# Patient Record
Sex: Female | Born: 1980 | Race: White | Hispanic: Yes
Health system: Southern US, Community
[De-identification: ages and names within clinical notes are randomized; demographics above are authoritative.]

---

## 2019-04-14 ENCOUNTER — Other Ambulatory Visit: Payer: Self-pay | Admitting: *Deleted

## 2019-04-14 DIAGNOSIS — Z20822 Contact with and (suspected) exposure to covid-19: Secondary | ICD-10-CM

## 2019-04-16 LAB — NOVEL CORONAVIRUS, NAA: SARS-CoV-2, NAA: NOT DETECTED

## 2019-04-21 ENCOUNTER — Other Ambulatory Visit: Payer: Self-pay | Admitting: *Deleted

## 2019-04-21 DIAGNOSIS — Z20822 Contact with and (suspected) exposure to covid-19: Secondary | ICD-10-CM

## 2019-04-23 LAB — NOVEL CORONAVIRUS, NAA: SARS-CoV-2, NAA: NOT DETECTED

## 2019-04-24 ENCOUNTER — Telehealth: Payer: Self-pay | Admitting: General Practice

## 2019-04-24 NOTE — Telephone Encounter (Signed)
Patient is calling to receive her negative COVID test results. Patient expressed understanding. 

## 2020-01-19 ENCOUNTER — Other Ambulatory Visit: Payer: Self-pay | Admitting: Nurse Practitioner

## 2020-01-19 ENCOUNTER — Other Ambulatory Visit (HOSPITAL_COMMUNITY): Payer: Self-pay | Admitting: Nurse Practitioner

## 2020-01-19 DIAGNOSIS — N852 Hypertrophy of uterus: Secondary | ICD-10-CM

## 2020-01-19 DIAGNOSIS — N921 Excessive and frequent menstruation with irregular cycle: Secondary | ICD-10-CM

## 2020-01-27 ENCOUNTER — Ambulatory Visit (HOSPITAL_COMMUNITY)
Admission: RE | Admit: 2020-01-27 | Discharge: 2020-01-27 | Disposition: A | Discharge status: Home / Self Care | Payer: Self-pay | Source: Ambulatory Visit | Attending: Nurse Practitioner | Admitting: Nurse Practitioner

## 2020-01-27 ENCOUNTER — Other Ambulatory Visit: Payer: Self-pay

## 2020-01-27 DIAGNOSIS — N921 Excessive and frequent menstruation with irregular cycle: Secondary | ICD-10-CM | POA: Insufficient documentation

## 2020-01-27 DIAGNOSIS — N852 Hypertrophy of uterus: Secondary | ICD-10-CM | POA: Insufficient documentation

## 2020-04-07 ENCOUNTER — Other Ambulatory Visit: Payer: Self-pay | Admitting: *Deleted

## 2020-04-07 DIAGNOSIS — R928 Other abnormal and inconclusive findings on diagnostic imaging of breast: Secondary | ICD-10-CM

## 2020-04-19 ENCOUNTER — Other Ambulatory Visit: Payer: Self-pay

## 2020-04-19 ENCOUNTER — Ambulatory Visit
Admission: RE | Admit: 2020-04-19 | Discharge: 2020-04-19 | Disposition: A | Discharge status: Home / Self Care | Payer: No Typology Code available for payment source | Source: Ambulatory Visit | Attending: *Deleted | Admitting: *Deleted

## 2020-04-19 DIAGNOSIS — R928 Other abnormal and inconclusive findings on diagnostic imaging of breast: Secondary | ICD-10-CM

## 2022-02-28 IMAGING — US US PELVIS COMPLETE WITH TRANSVAGINAL
1 series · 13 of 25 positions shown · non-contrast
Comparison: None

CLINICAL DATA: Enlarged uterus on exam, pelvic pain for 4 months,
heavy menses for 2 weeks, LMP 01/18/2020, past history Caesarean
section and tubal ligation

EXAM:
TRANSABDOMINAL AND TRANSVAGINAL ULTRASOUND OF PELVIS
TECHNIQUE: Both transabdominal and transvaginal ultrasound examinations of the
pelvis were performed. Transabdominal technique was performed for
global imaging of the pelvis including uterus, ovaries, adnexal
regions, and pelvic cul-de-sac. It was necessary to proceed with
endovaginal exam following the transabdominal exam to visualize the
endometrium and ovaries.

[Series 1: us pelvic complete with transvaginal · 13 of 96 slices shown]
[im 1/96]
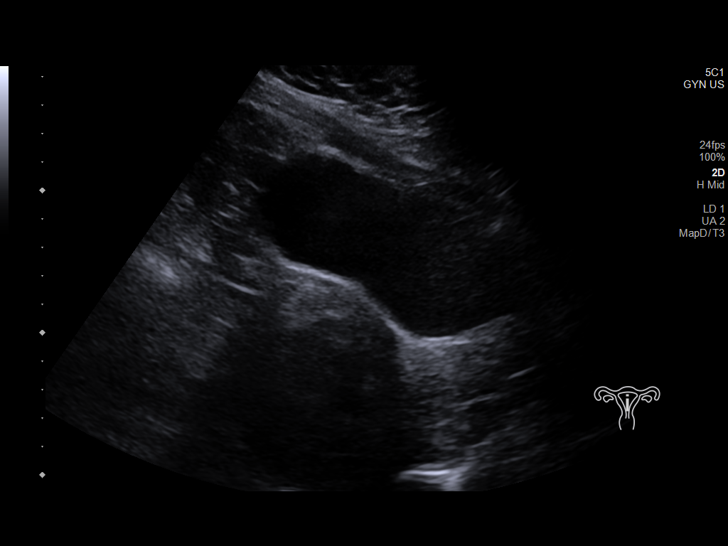
[im 8/96]
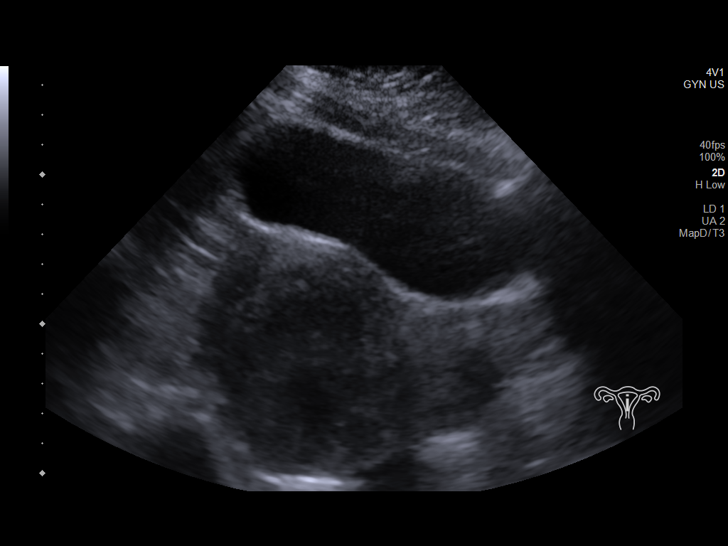
[im 16/96]
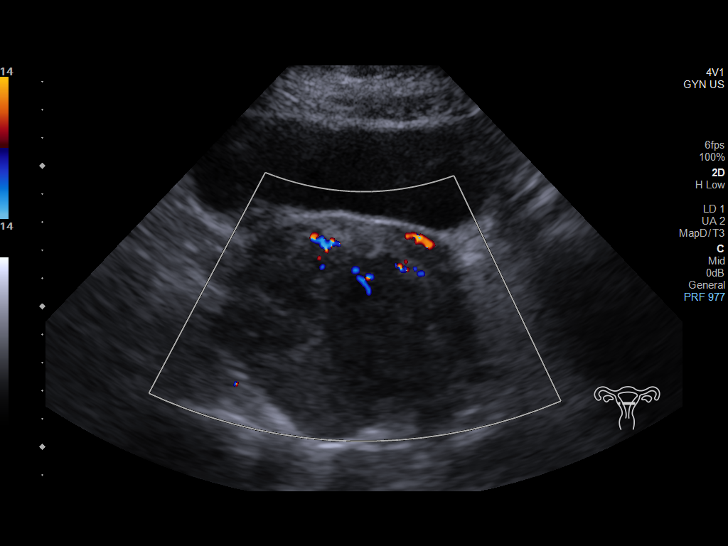
[im 24/96]
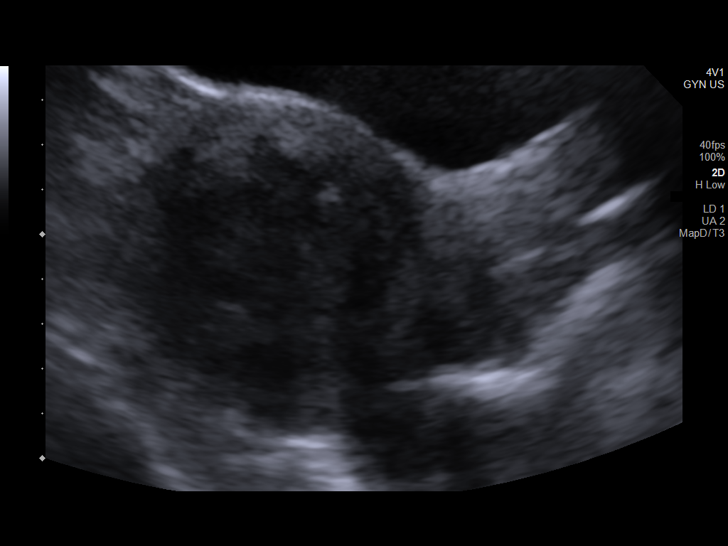
[im 32/96]
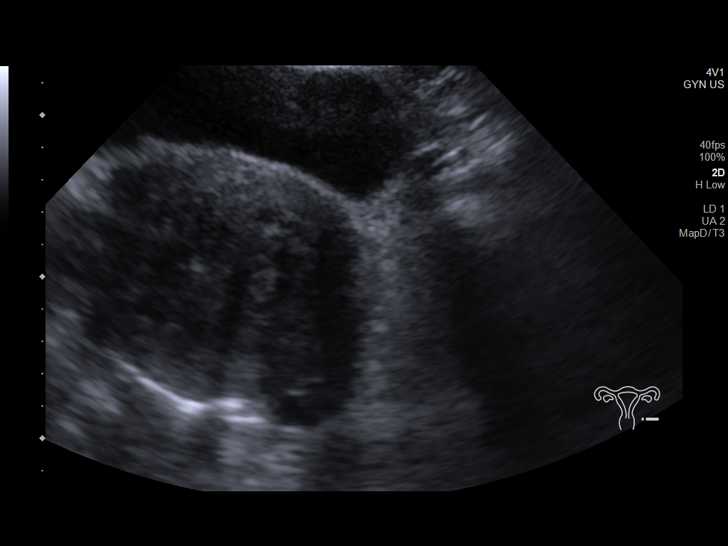
[im 40/96]
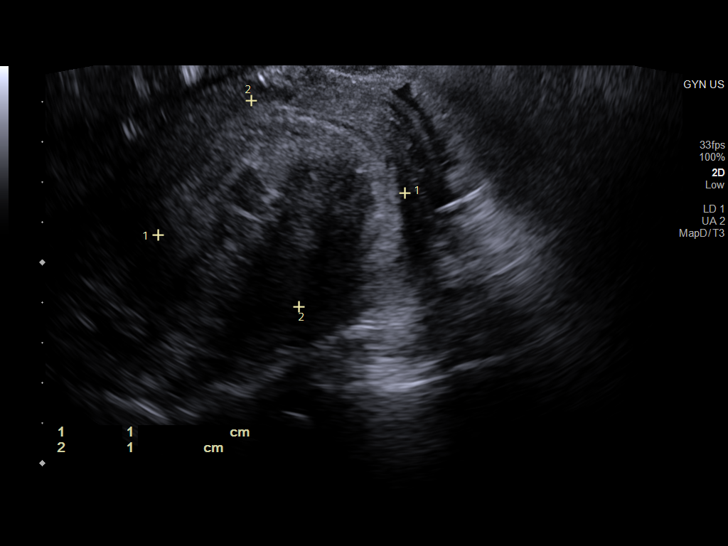
[im 48/96]
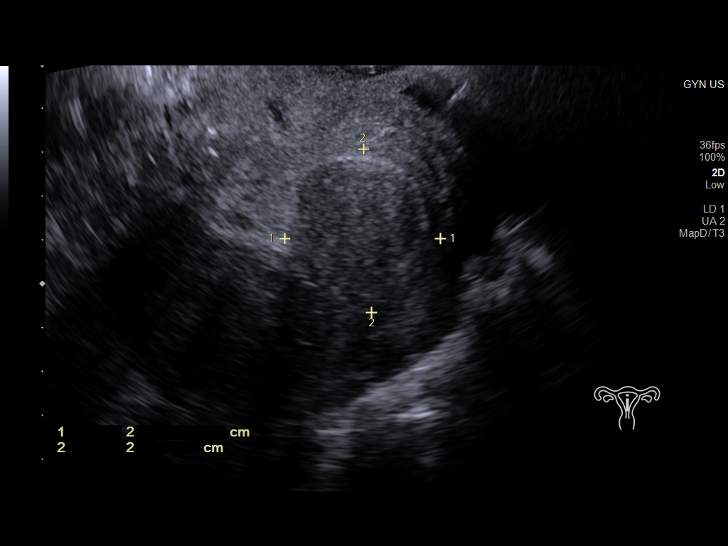
[im 56/96]
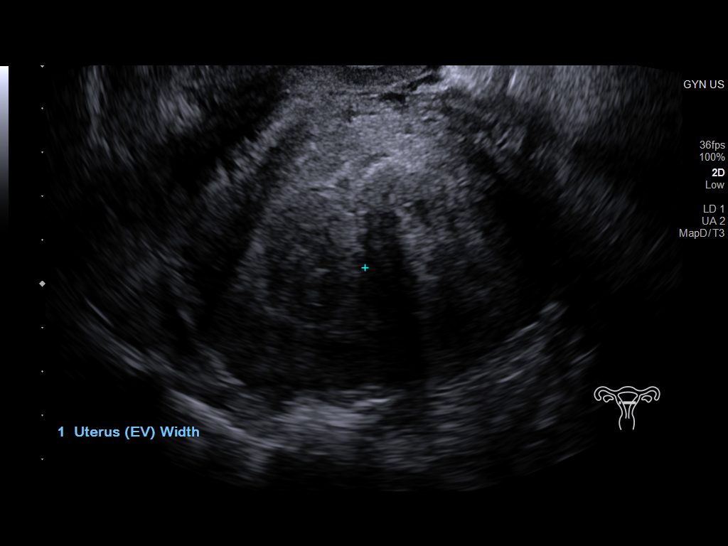
[im 64/96]
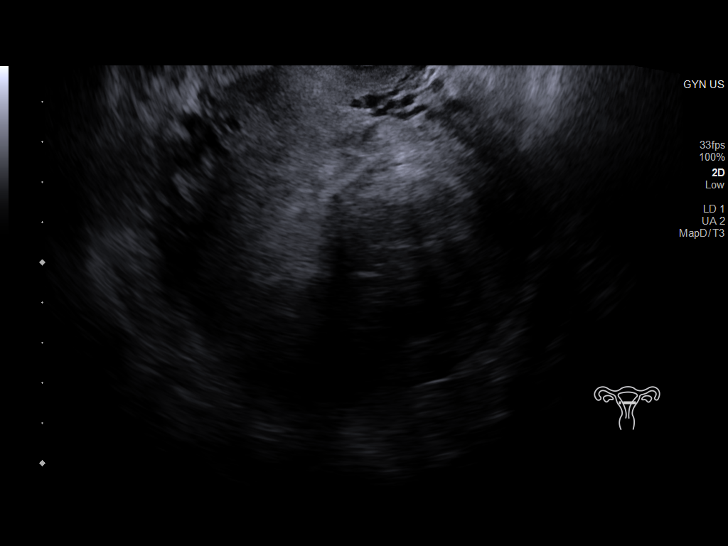
[im 72/96]
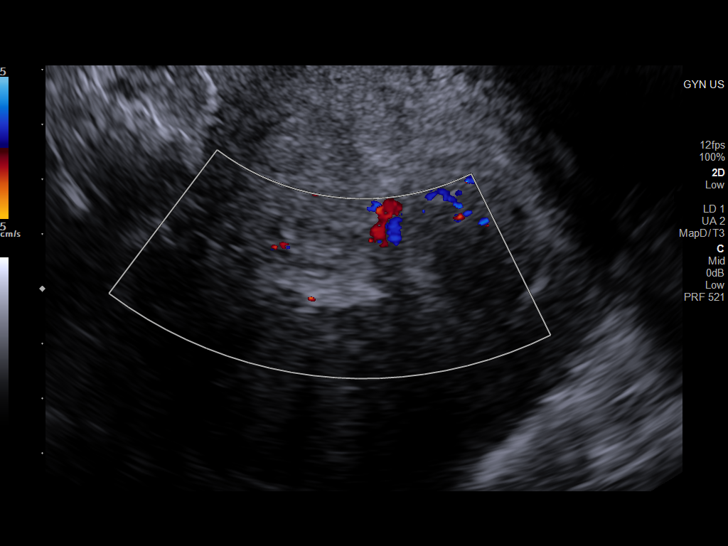
[im 80/96]
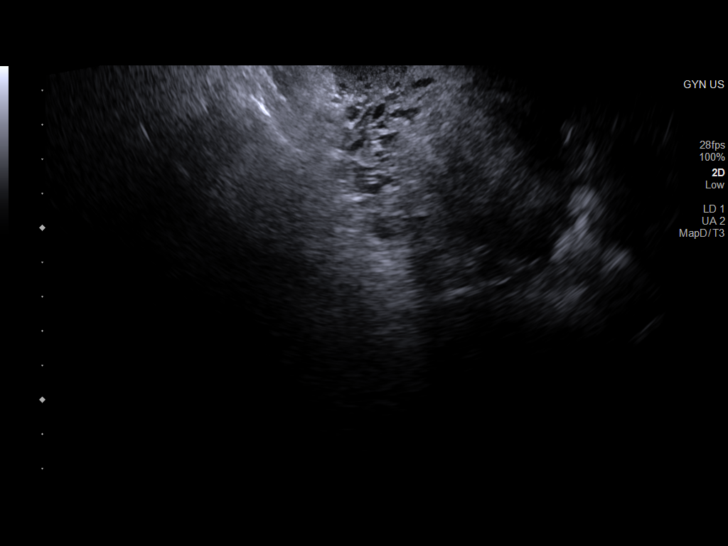
[im 88/96]
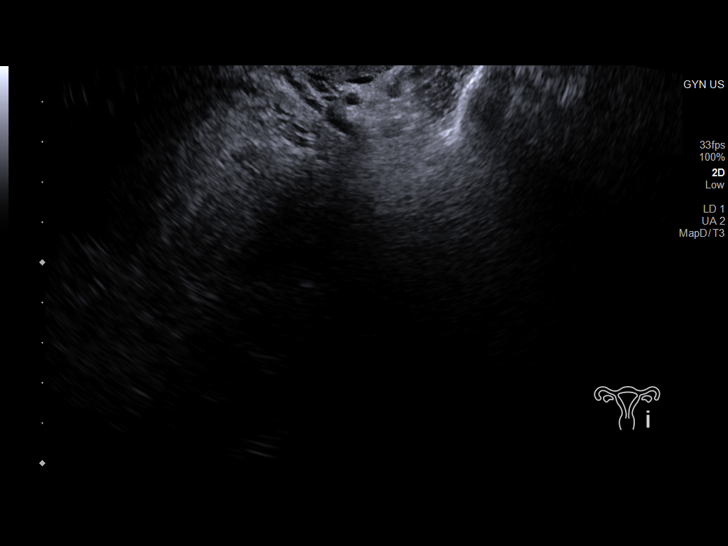
[im 96/96]
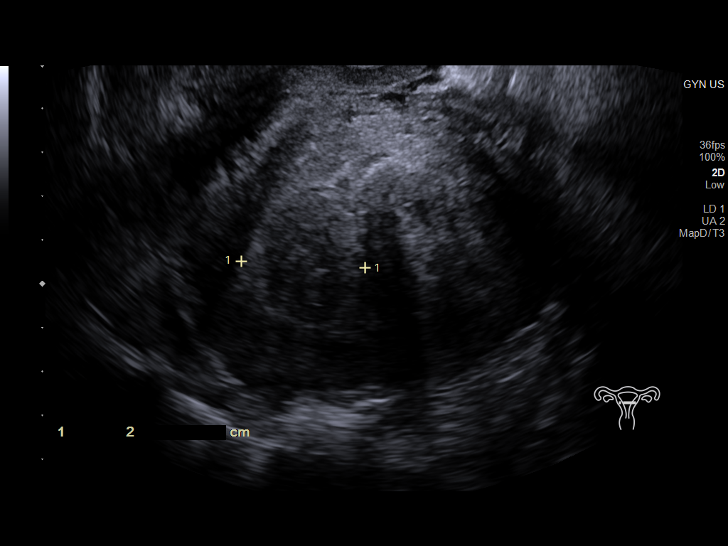

[13 of 25 positions shown; findings below may reference images not displayed]

FINDINGS: Uterus

Measurements: 10.8 x 7.4 x 9.9 cm = volume: 410 mL. Anteverted.
Large mass at central upper uterus 6.2 x 5.3 x 3.8 cm consistent
with a leiomyoma, most likely extending submucosal. Additional
posterior submucosal leiomyoma 3.6 x 3.7 x 2.5 cm at mid uterus.
Transmural leiomyoma at posterior upper uterus 5.7 x 4.3 x 4.0 cm.

Endometrium

Thickness: 6 mm.  No definite endometrial fluid or focal abnormality

Right ovary

Measurements: 2.6 x 2.0 x 2.3 cm = volume: 6.2 mL. Poorly visualized
due to position. No gross mass.

Left ovary

Measurements: 3.3 x 2.6 x 3.3 cm = volume: 15.0 mL. Poorly
visualized due to position, incompletely characterized.

Other findings

No free pelvic fluid or adnexal masses.
IMPRESSION: At least 3 uterine leiomyomata as above, 2 of which likely extends
submucosal.

Suboptimal visualization of ovaries.

## 2022-05-22 IMAGING — MG MM BREAST BX W/ LOC DEV 1ST LESION IMAGE BX SPEC STEREO GUIDE*R*
8 of 14 series · 8 of 26 positions shown · non-contrast
Comparison: Previous exams.
COMPARISON: Previous exams.

Addendum:
CLINICAL DATA: Patient presents for stereotactic core needle biopsy
of indeterminate right breast calcifications.

EXAM:
RIGHT BREAST STEREOTACTIC CORE NEEDLE BIOPSY

[R (1 of 8)]
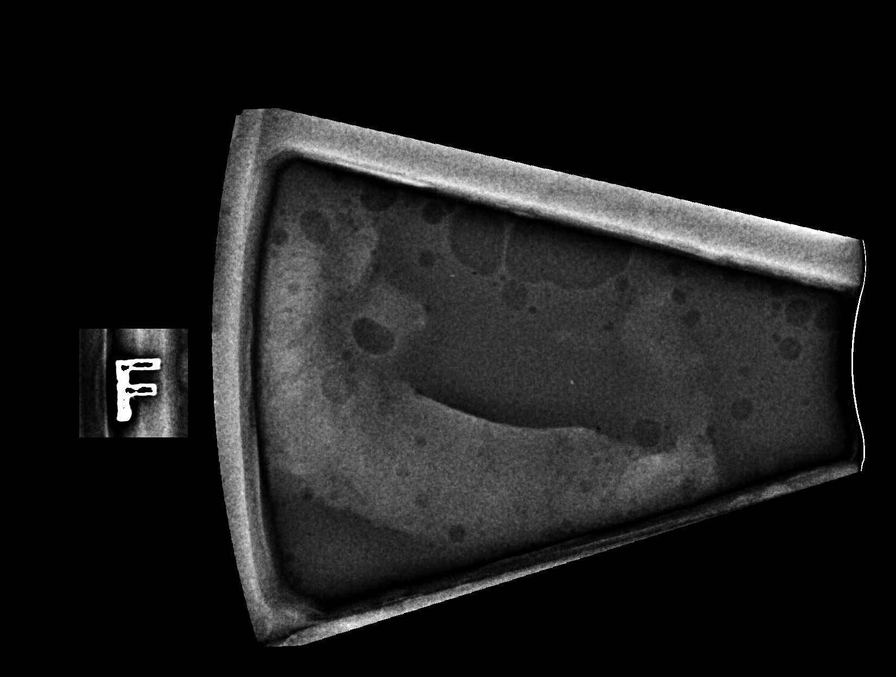

[R (2 of 8)]
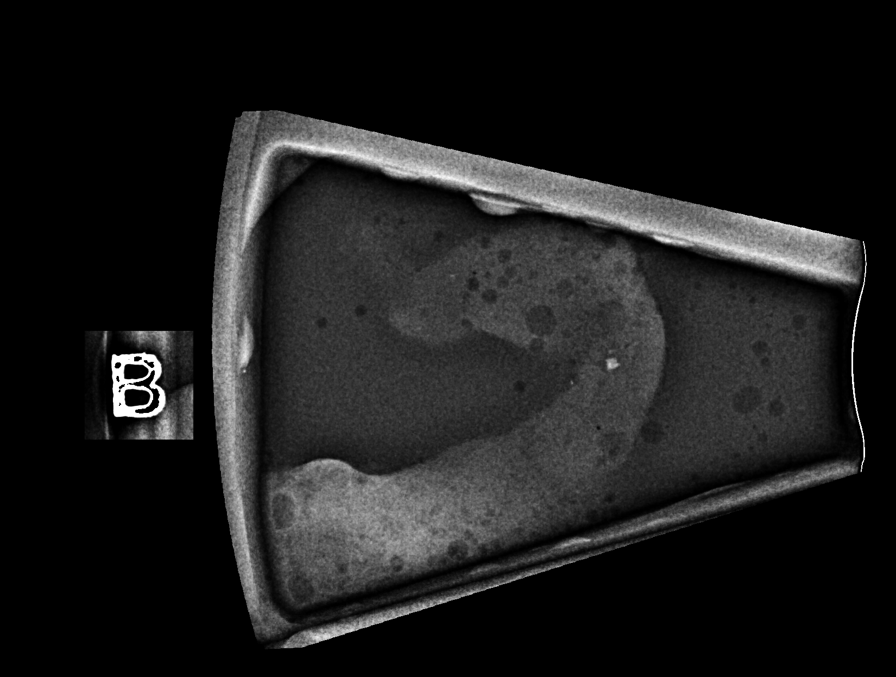

[R (3 of 8)]
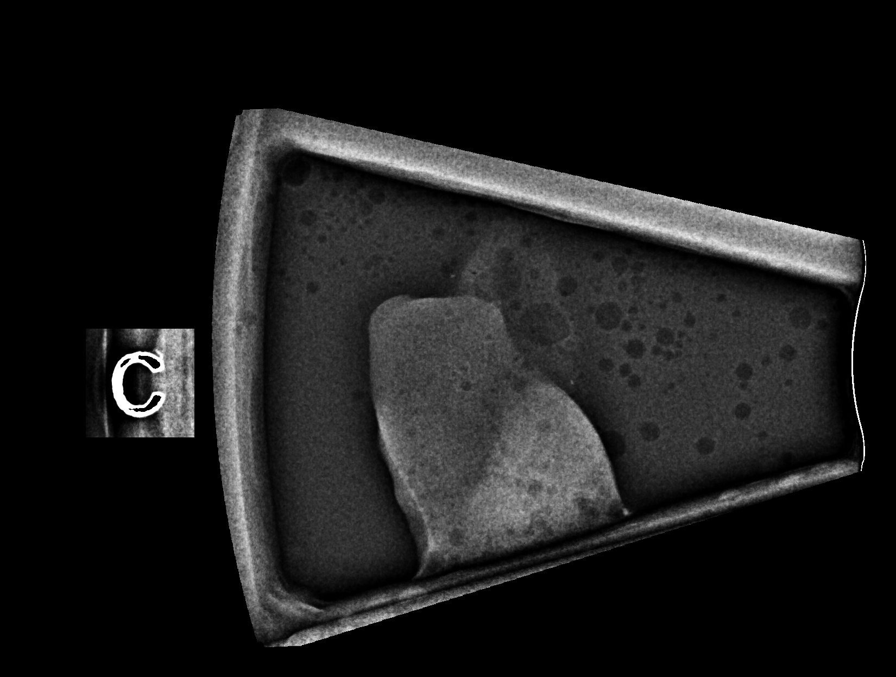

[R (4 of 8)]
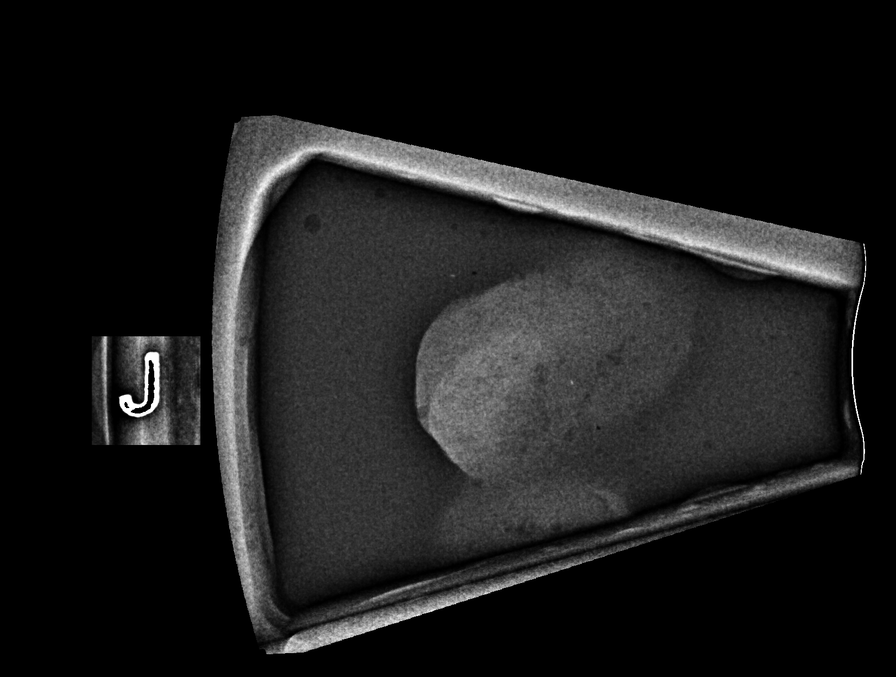

[R (5 of 8)]
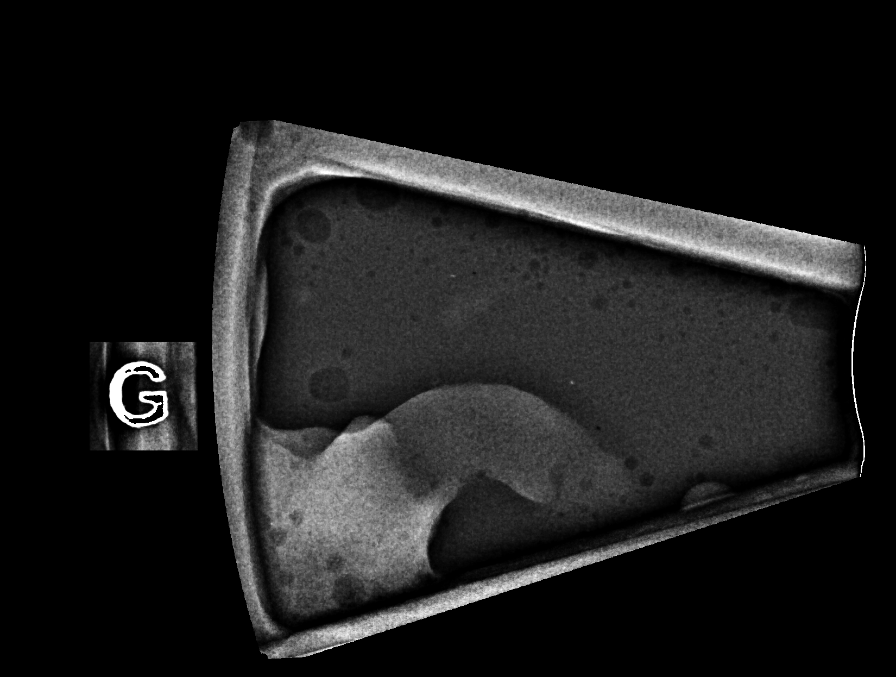

[R (6 of 8)]
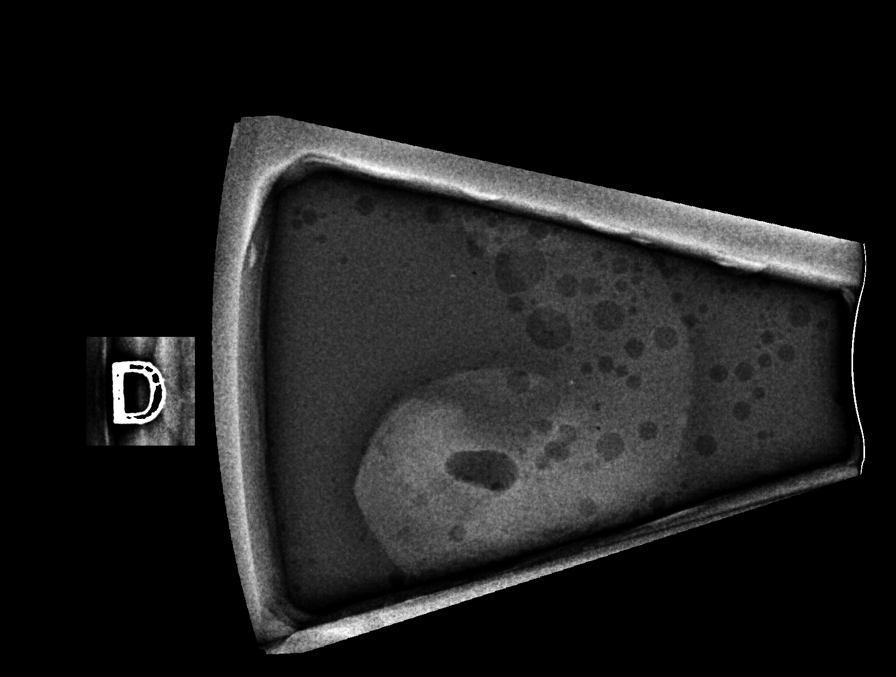

[R (7 of 8)]
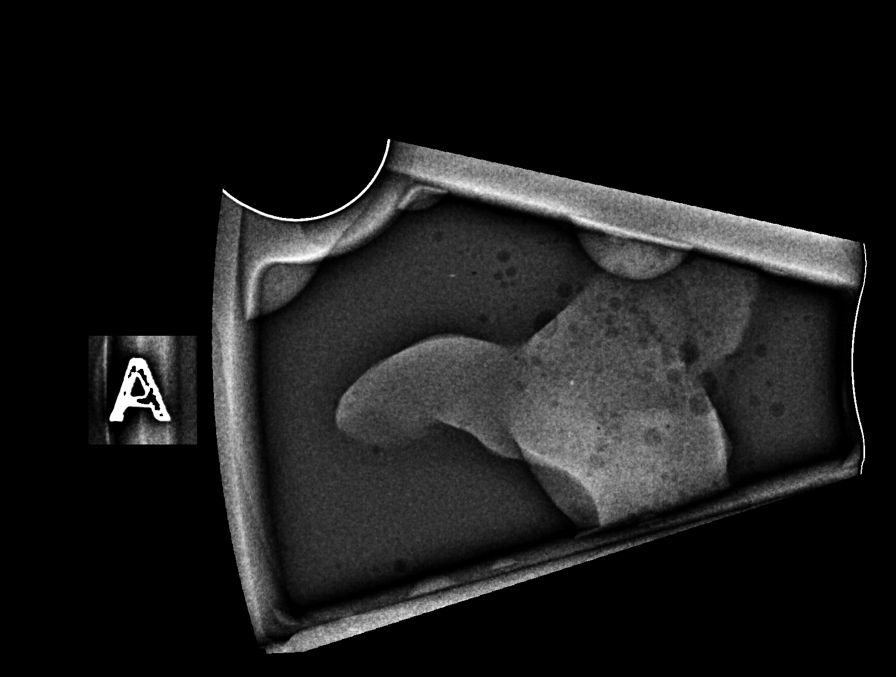

[R (8 of 8)]
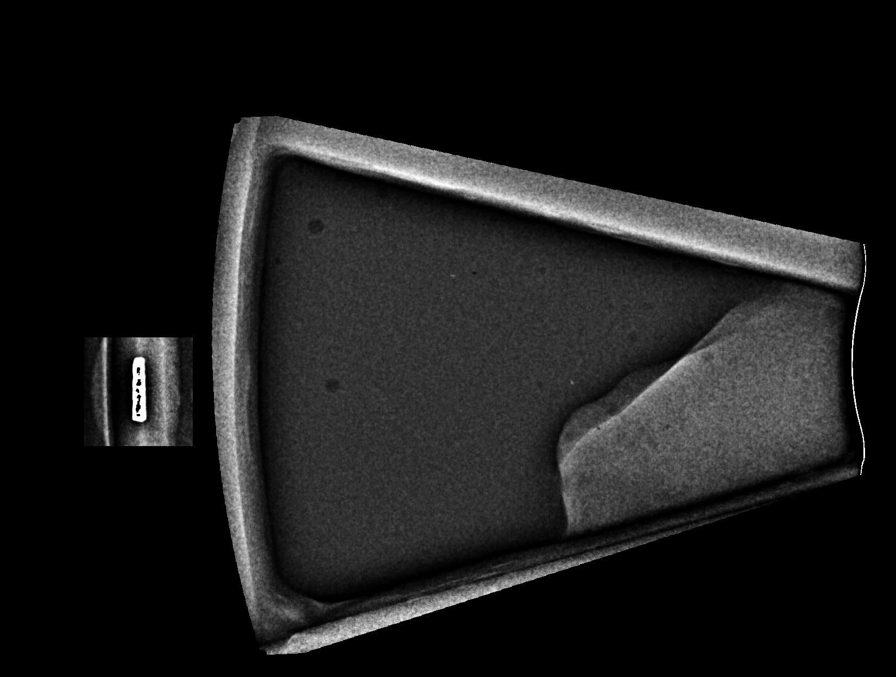

[8 of 26 positions shown; findings below may reference images not displayed]



Using sterile technique and 1% Lidocaine as local anesthetic, under
stereotactic guidance, a 9 gauge vacuum assisted device was used to
perform core needle biopsy of calcifications in the upper outer
quadrant of the right breast using a lateral approach. Specimen
radiograph was performed showing a few calcifications for which
biopsy was performed. Specimens with calcifications are identified
for pathology.

Lesion quadrant: Upper outer quadrant

At the conclusion of the procedure, an X shaped tissue marker clip
was deployed into the biopsy cavity. Follow-up 2-view mammogram was
performed and dictated separately.
IMPRESSION: Stereotactic-guided biopsy of right breast calcifications. No
apparent complications.

ADDENDUM:
Pathology revealed FIBROCYSTIC CHANGE WITH CALCIFICATIONS of the
RIGHT breast, upper outer quadrant. This was found to be concordant
by Dr. Kim Allen [place].

Pathology results were discussed with the patient by telephone with
patient reported doing well after the biopsy with tenderness at the
site. Post biopsy instructions and care were reviewed and questions
were answered. The patient was encouraged to call The [REDACTED]

The patient was instructed to return for right diagnostic
mammography and possible ultrasound in 6 months and informed a
reminder notice would be sent regarding this appointment.

Pathology results reported by Kiagus Riza Hoyaranda RN on 04/20/2020.



Using sterile technique and 1% Lidocaine as local anesthetic, under
stereotactic guidance, a 9 gauge vacuum assisted device was used to
perform core needle biopsy of calcifications in the upper outer
quadrant of the right breast using a lateral approach. Specimen
radiograph was performed showing a few calcifications for which
biopsy was performed. Specimens with calcifications are identified
for pathology.

Lesion quadrant: Upper outer quadrant

At the conclusion of the procedure, an X shaped tissue marker clip
was deployed into the biopsy cavity. Follow-up 2-view mammogram was
performed and dictated separately.
IMPRESSION: Stereotactic-guided biopsy of right breast calcifications. No
apparent complications.
